# Patient Record
Sex: Male | Born: 1965 | Race: White | Hispanic: No | Marital: Married | Smoking: Never smoker
Health system: Southern US, Community
[De-identification: ages and names within clinical notes are randomized; demographics above are authoritative.]

## PROBLEM LIST (undated history)

## (undated) DIAGNOSIS — E785 Hyperlipidemia, unspecified: Secondary | ICD-10-CM

## (undated) DIAGNOSIS — R42 Dizziness and giddiness: Secondary | ICD-10-CM

## (undated) DIAGNOSIS — B20 Human immunodeficiency virus [HIV] disease: Secondary | ICD-10-CM

## (undated) HISTORY — PX: HYDROCELE EXCISION / REPAIR: SUR1145

---

## 2018-01-01 ENCOUNTER — Other Ambulatory Visit: Payer: Self-pay | Admitting: Podiatry

## 2018-01-01 DIAGNOSIS — D489 Neoplasm of uncertain behavior, unspecified: Secondary | ICD-10-CM

## 2018-01-21 ENCOUNTER — Ambulatory Visit
Admission: RE | Admit: 2018-01-21 | Discharge: 2018-01-21 | Disposition: A | Discharge status: Home / Self Care | Payer: BLUE CROSS/BLUE SHIELD | Source: Ambulatory Visit | Attending: Podiatry | Admitting: Podiatry

## 2018-01-21 DIAGNOSIS — M19072 Primary osteoarthritis, left ankle and foot: Secondary | ICD-10-CM | POA: Insufficient documentation

## 2018-01-21 DIAGNOSIS — M25475 Effusion, left foot: Secondary | ICD-10-CM | POA: Diagnosis not present

## 2018-01-21 DIAGNOSIS — D489 Neoplasm of uncertain behavior, unspecified: Secondary | ICD-10-CM | POA: Diagnosis present

## 2018-01-21 MED ORDER — GADOBUTROL 1 MMOL/ML IV SOLN
7.5000 mL | Freq: Once | INTRAVENOUS | Status: AC | PRN
  Administered 2018-01-21: 7.5 mL via INTRAVENOUS

## 2018-02-12 ENCOUNTER — Other Ambulatory Visit: Payer: Self-pay | Admitting: Podiatry

## 2018-02-13 ENCOUNTER — Other Ambulatory Visit: Payer: Self-pay

## 2018-02-13 ENCOUNTER — Other Ambulatory Visit: Payer: Self-pay | Admitting: Podiatry

## 2018-02-13 ENCOUNTER — Encounter: Payer: Self-pay | Admitting: *Deleted

## 2018-02-19 ENCOUNTER — Ambulatory Visit: Payer: BLUE CROSS/BLUE SHIELD | Attending: Infectious Diseases | Admitting: Infectious Diseases

## 2018-02-19 ENCOUNTER — Encounter: Payer: Self-pay | Admitting: Infectious Diseases

## 2018-02-19 VITALS — BP 114/80 | HR 85 | Temp 97.9°F | Wt 179.0 lb

## 2018-02-19 DIAGNOSIS — E785 Hyperlipidemia, unspecified: Secondary | ICD-10-CM | POA: Diagnosis not present

## 2018-02-19 DIAGNOSIS — Z79899 Other long term (current) drug therapy: Secondary | ICD-10-CM

## 2018-02-19 DIAGNOSIS — E881 Lipodystrophy, not elsewhere classified: Secondary | ICD-10-CM | POA: Insufficient documentation

## 2018-02-19 DIAGNOSIS — B20 Human immunodeficiency virus [HIV] disease: Secondary | ICD-10-CM

## 2018-02-19 DIAGNOSIS — M2021 Hallux rigidus, right foot: Secondary | ICD-10-CM | POA: Diagnosis not present

## 2018-02-19 DIAGNOSIS — M2022 Hallux rigidus, left foot: Secondary | ICD-10-CM | POA: Diagnosis not present

## 2018-02-19 NOTE — Patient Instructions (Addendum)
You are here to engage in care today. Your Vl < 40 and cd4 846. Continue Genvoya We can check your labs in 6 months and follow up 6 months

## 2018-02-19 NOTE — Progress Notes (Signed)
NAME: Keith Steele  DOB: October 29, 1965  MRN: [id number]  Date/Time: 02/19/2018 10:26 AM Subjective:  REASON FOR CONSULT: to establish care for HIV ? Keith Steele is a 52 y.o. with a history of HIV is here to establish care Diagnosed in 1993 currently on genvoya- was follwoed by Dr.Fitzgerald who is no longer in the country  100% adherent to genvoya- no side effects like nausea, vomiting or abdominal pain  HIV diagnosed in [place] >200 OI none other than shingles HAARt history Azt, D4T, sustiva, crixivan complera Now genvoya Acquired thru sex with men Genotype-unknown Was in [place], then [place], now in [place]? Past Medical History:  Diagnosis Date  . HIV (human immunodeficiency virus infection) ([place])   . Hyperlipidemia   . Vertigo    2 episodes. last in 2017   lipoatrophy face - gets sculptra injections Hallux rigidus rt great toe- gets steroid injections Hallux rigidus left great toe  Past Surgical History:  Procedure Laterality Date  . HYDROCELE EXCISION / REPAIR     sculptra fillers  [place] Lives with his partner/husband  FH  Dad-alzheimers GM alzheimers Uncle-DM  ? Current Outpatient Medications  Medication Sig Dispense Refill  . elvitegravir-cobicistat-emtricitabine-tenofovir (GENVOYA) 150-150-200-10 MG TABS tablet Take 1 tablet by mouth daily.    . Multiple Vitamin (MULTIVITAMIN) tablet Take 1 tablet by mouth daily.    . pravastatin (PRAVACHOL) 20 MG tablet Take 20 mg by mouth daily.     No current facility-administered medications for this visit.     REVIEW OF SYSTEMS:  Const: negative fever, negative chills, negative weight loss Eyes: negative diplopia or visual changes, negative eye pain ENT: negative coryza, negative sore throat Resp: negative cough, hemoptysis, dyspnea Cards: negative for chest pain, palpitations, lower extremity edema GU: negative for frequency, dysuria and hematuria Skin: negative for rash and pruritus Heme:  negative for easy bruising and gum/nose bleeding MS: pain left great toe negative for myalgias, , back pain and muscle weakness Neurolo:negative for headaches, dizziness, vertigo, memory problems  Psych: negative for feelings of anxiety, depression   Objective:  VITALS:  BP 114/80 (BP Location: Left Arm, Patient Position: Sitting, Cuff Size: Normal)   Pulse 85   Temp 97.9 F (36.6 C) (Oral)   Wt 179 lb (81.2 kg)   BMI 26.43 kg/m  PHYSICAL EXAM:  General: Alert, cooperative, no distress, appears stated age.  Head: Normocephalic, without obvious abnormality, atraumatic. Eyes: Conjunctivae clear, anicteric sclerae. Pupils are equal Nose: Nares normal. No drainage or sinus tenderness. Throat: Lips, mucosa, and tongue normal. No Thrush Mild facial lipoatrphy Neck: Supple, symmetrical, no adenopathy, thyroid: non tender no carotid bruit and no JVD. Back: No CVA tenderness. Lungs: Clear to auscultation bilaterally. No Wheezing or Rhonchi. No rales. Heart: Regular rate and rhythm, no murmur, rub or gallop. Abdomen: Soft, non-tender,not distended. Bowel sounds normal. No masses Extremities: Extremities normal, atraumatic, no cyanosis. No edema. No clubbing Skin: No rashes or lesions. Not Jaundiced Lymph: Cervical, supraclavicular normal. Neurologic: Grossly non-focal Pertinent Labs Aug 2019- Vl < 20 and cd4 is 856   Health maintenance Vaccination pneumovac- 23 Prevnar-13 HepB HepA TdaP Flu-2019 Herpes zoster- 1  HPV ______________________ labs RPR-11/21/17 NR HEPC ab-NR feb 2019 Lipid-aug 2018 TC 169, TGL 78, HDL 54.9, LDL 99 CMv TOXO -IGG/IgM HIV VL < 40 Cd4-846 (46%) quantiferon Gold GC/CHL [id number] Genotype HIV antibody  Preventive  Dental-march 2019 Colonoscopy-2017 Opthal-nov 2019 Anal  Pap- 2013 mammogram  Impression/Recommendation ? HIV- well controlled on HAART- Genvoya ( elvitegravir+cobi+FTC+TAF)  Last Vl < 40 and cd4 is 856 Continue  HAART  Hyperlipidemia on pravastatin  Left hallux rigidus- surgery planned by Dr.Fowler  ?Cr of 1.3 with crcl of 58 -stable Partner notification- pt's partner is aware of his status and he is negative- in a monogamous relationship- U=U  ( undetectable + untransmittable)   Labs planned in the next few months quantiferon gold, RPR, CMP, CD4, HIV We need to update his health maintenance- need to get old records from [facility] regarding his vaccination status Will need anal pap in the future  Follow up 6 months ? ___________________________________________________ Discussed with patient in detail about the pathogenesis, health maintenance, labs and adherence

## 2018-02-20 ENCOUNTER — Encounter
Admission: RE | Disposition: A | Discharge status: Home / Self Care | Payer: Self-pay | Source: Ambulatory Visit | Attending: Podiatry

## 2018-02-20 ENCOUNTER — Ambulatory Visit
Admission: RE | Admit: 2018-02-20 | Discharge: 2018-02-20 | Disposition: A | Discharge status: Home / Self Care | Payer: BLUE CROSS/BLUE SHIELD | Source: Ambulatory Visit | Attending: Podiatry | Admitting: Podiatry

## 2018-02-20 ENCOUNTER — Ambulatory Visit: Payer: BLUE CROSS/BLUE SHIELD | Admitting: Anesthesiology

## 2018-02-20 DIAGNOSIS — Z820 Family history of epilepsy and other diseases of the nervous system: Secondary | ICD-10-CM | POA: Insufficient documentation

## 2018-02-20 DIAGNOSIS — M205X2 Other deformities of toe(s) (acquired), left foot: Secondary | ICD-10-CM | POA: Insufficient documentation

## 2018-02-20 DIAGNOSIS — E785 Hyperlipidemia, unspecified: Secondary | ICD-10-CM | POA: Diagnosis not present

## 2018-02-20 DIAGNOSIS — Z79899 Other long term (current) drug therapy: Secondary | ICD-10-CM | POA: Diagnosis not present

## 2018-02-20 DIAGNOSIS — Z21 Asymptomatic human immunodeficiency virus [HIV] infection status: Secondary | ICD-10-CM | POA: Diagnosis not present

## 2018-02-20 DIAGNOSIS — R2242 Localized swelling, mass and lump, left lower limb: Secondary | ICD-10-CM | POA: Insufficient documentation

## 2018-02-20 DIAGNOSIS — N189 Chronic kidney disease, unspecified: Secondary | ICD-10-CM | POA: Diagnosis not present

## 2018-02-20 HISTORY — DX: Hyperlipidemia, unspecified: E78.5

## 2018-02-20 HISTORY — PX: CHEILECTOMY: SHX1336

## 2018-02-20 HISTORY — DX: Dizziness and giddiness: R42

## 2018-02-20 HISTORY — PX: MASS EXCISION: SHX2000

## 2018-02-20 HISTORY — DX: Human immunodeficiency virus (HIV) disease: B20

## 2018-02-20 SURGERY — CHEILECTOMY
Anesthesia: General | Site: Foot | Laterality: Left

## 2018-02-20 MED ORDER — DEXAMETHASONE SODIUM PHOSPHATE 4 MG/ML IJ SOLN
INTRAMUSCULAR | Status: DC | PRN
  Administered 2018-02-20: 4 mg via INTRAVENOUS

## 2018-02-20 MED ORDER — MIDAZOLAM HCL 5 MG/5ML IJ SOLN
INTRAMUSCULAR | Status: DC | PRN
  Administered 2018-02-20: 2 mg via INTRAVENOUS

## 2018-02-20 MED ORDER — ONDANSETRON HCL 4 MG/2ML IJ SOLN
INTRAMUSCULAR | Status: DC | PRN
  Administered 2018-02-20: 4 mg via INTRAVENOUS

## 2018-02-20 MED ORDER — FENTANYL CITRATE (PF) 100 MCG/2ML IJ SOLN: 25.0000 ug | INTRAMUSCULAR | Status: DC | PRN

## 2018-02-20 MED ORDER — OXYCODONE-ACETAMINOPHEN 5-325 MG PO TABS: 1.0000 | ORAL_TABLET | Freq: Four times a day (QID) | ORAL | 0 refills | Status: AC | PRN

## 2018-02-20 MED ORDER — PROPOFOL 10 MG/ML IV BOLUS
INTRAVENOUS | Status: DC | PRN
  Administered 2018-02-20: 200 mg via INTRAVENOUS

## 2018-02-20 MED ORDER — ONDANSETRON HCL 4 MG/2ML IJ SOLN: 4.0000 mg | Freq: Once | INTRAMUSCULAR | Status: DC | PRN

## 2018-02-20 MED ORDER — OXYCODONE HCL 5 MG PO TABS: 5.0000 mg | ORAL_TABLET | Freq: Once | ORAL | Status: DC | PRN

## 2018-02-20 MED ORDER — EPHEDRINE SULFATE 50 MG/ML IJ SOLN
INTRAMUSCULAR | Status: DC | PRN
  Administered 2018-02-20 (×4): 10 mg via INTRAVENOUS

## 2018-02-20 MED ORDER — BUPIVACAINE HCL 0.25 % IJ SOLN
INTRAMUSCULAR | Status: DC | PRN
  Administered 2018-02-20: 10 mL

## 2018-02-20 MED ORDER — POVIDONE-IODINE 7.5 % EX SOLN
Freq: Once | CUTANEOUS | Status: AC
  Administered 2018-02-20: 12:00:00 via TOPICAL

## 2018-02-20 MED ORDER — CEFAZOLIN SODIUM-DEXTROSE 2-4 GM/100ML-% IV SOLN
2.0000 g | INTRAVENOUS | Status: AC
  Administered 2018-02-20: 2 g via INTRAVENOUS

## 2018-02-20 MED ORDER — BUPIVACAINE LIPOSOME 1.3 % IJ SUSP
INTRAMUSCULAR | Status: DC | PRN
  Administered 2018-02-20: 10 mL

## 2018-02-20 MED ORDER — LIDOCAINE HCL (CARDIAC) PF 100 MG/5ML IV SOSY
PREFILLED_SYRINGE | INTRAVENOUS | Status: DC | PRN
  Administered 2018-02-20: 40 mg via INTRATRACHEAL

## 2018-02-20 MED ORDER — OXYCODONE HCL 5 MG/5ML PO SOLN: 5.0000 mg | Freq: Once | ORAL | Status: DC | PRN

## 2018-02-20 MED ORDER — ACETAMINOPHEN 160 MG/5ML PO SOLN: 325.0000 mg | ORAL | Status: DC | PRN

## 2018-02-20 MED ORDER — LACTATED RINGERS IV SOLN
INTRAVENOUS | Status: DC
  Administered 2018-02-20 (×2): via INTRAVENOUS

## 2018-02-20 MED ORDER — ACETAMINOPHEN 325 MG PO TABS: 325.0000 mg | ORAL_TABLET | ORAL | Status: DC | PRN

## 2018-02-20 MED ORDER — FENTANYL CITRATE (PF) 100 MCG/2ML IJ SOLN
INTRAMUSCULAR | Status: DC | PRN
  Administered 2018-02-20 (×2): 25 ug via INTRAVENOUS

## 2018-02-20 SURGICAL SUPPLY — 34 items
BANDAGE ELASTIC 4 VELCRO NS (GAUZE/BANDAGES/DRESSINGS) ×2 IMPLANT
BENZOIN TINCTURE PRP APPL 2/3 (GAUZE/BANDAGES/DRESSINGS) ×2 IMPLANT
BLADE OSC/SAGITTAL MD 9X18.5 (BLADE) ×2 IMPLANT
BNDG COHESIVE 4X5 TAN STRL (GAUZE/BANDAGES/DRESSINGS) ×2 IMPLANT
BNDG ESMARK 4X12 TAN STRL LF (GAUZE/BANDAGES/DRESSINGS) ×2 IMPLANT
BNDG GAUZE 4.5X4.1 6PLY STRL (MISCELLANEOUS) ×2 IMPLANT
BNDG STRETCH 4X75 STRL LF (GAUZE/BANDAGES/DRESSINGS) ×2 IMPLANT
CANISTER SUCT 1200ML W/VALVE (MISCELLANEOUS) ×2 IMPLANT
COVER LIGHT HANDLE UNIVERSAL (MISCELLANEOUS) ×4 IMPLANT
CUFF TOURN SGL QUICK 18 (TOURNIQUET CUFF) ×2 IMPLANT
DRAPE FLUOR MINI C-ARM 54X84 (DRAPES) ×2 IMPLANT
DURAPREP 26ML APPLICATOR (WOUND CARE) ×2 IMPLANT
ELECT REM PT RETURN 9FT ADLT (ELECTROSURGICAL) ×2
ELECTRODE REM PT RTRN 9FT ADLT (ELECTROSURGICAL) ×1 IMPLANT
GAUZE PETRO XEROFOAM 1X8 (MISCELLANEOUS) ×2 IMPLANT
GAUZE SPONGE 4X4 12PLY STRL (GAUZE/BANDAGES/DRESSINGS) ×2 IMPLANT
GLOVE BIO SURGEON STRL SZ7.5 (GLOVE) ×2 IMPLANT
GLOVE INDICATOR 8.0 STRL GRN (GLOVE) ×2 IMPLANT
GOWN STRL REUS W/ TWL LRG LVL3 (GOWN DISPOSABLE) ×2 IMPLANT
GOWN STRL REUS W/TWL LRG LVL3 (GOWN DISPOSABLE) ×2
KIT TURNOVER KIT A (KITS) ×2 IMPLANT
NS IRRIG 500ML POUR BTL (IV SOLUTION) ×2 IMPLANT
PACK EXTREMITY ARMC (MISCELLANEOUS) ×2 IMPLANT
PENCIL SMOKE EVACUATOR (MISCELLANEOUS) ×2 IMPLANT
RASP SM TEAR CROSS CUT (RASP) ×2 IMPLANT
STOCKINETTE IMPERVIOUS LG (DRAPES) ×2 IMPLANT
STRIP CLOSURE SKIN 1/4X4 (GAUZE/BANDAGES/DRESSINGS) ×2 IMPLANT
SUT MNCRL 5-0+ PC-1 (SUTURE) IMPLANT
SUT MNCRL+ 5-0 UNDYED PC-3 (SUTURE) ×1 IMPLANT
SUT MONOCRYL 5-0 (SUTURE) ×1
SUT VIC AB 3-0 SH 27 (SUTURE) ×1
SUT VIC AB 3-0 SH 27X BRD (SUTURE) ×1 IMPLANT
SUT VIC AB 4-0 SH 27 (SUTURE) ×1
SUT VIC AB 4-0 SH 27XANBCTRL (SUTURE) ×1 IMPLANT

## 2018-02-20 NOTE — Op Note (Signed)
Operative note   Surgeon:Keith Steele Lawyer: None    Preop diagnosis: 1.  Hallux limitus left first MTPJ 2.  Soft tissue mass plantar left great toe    Postop diagnosis: Same    Procedure: 1.  Cheilectomy left first MTPJ 2 excision soft tissue mass plantar left great toe 3 intraoperative use of fluoroscopy    EBL: Minimal    Anesthesia:local and general.  Local consisted of a one-to-one mixture of 0.25% bupivacaine and Exparel long-acting anesthetic.  A total of 20 cc was used    Hemostasis: Ankle tourniquet inflated to 200 mmHg for approximately 45 minutes    Specimen: Soft tissue mass plantar left great toe    Complications: None    Operative indications:Keith Steele is an 52 y.o. that presents today for surgical intervention.  The risks/benefits/alternatives/complications have been discussed and consent has been given.    Procedure:  Patient was brought into the OR and placed on the operating table in thesupine position. After anesthesia was obtained theleft lower extremity was prepped and draped in usual sterile fashion.  Prior to incision the first MTPJ was evaluated with use of fluoroscopy that revealed a small amount of periarticular spurring.  Attention was initially directed to the dorsal aspect of the first MTPJ where dorsal medial incision was performed.  Sharp and blunt dissection carried down to the capsule.  Longitudinal capsulotomy was then performed.  This exposed the head of the metatarsal and base of the proximal phalanx.  This time there was noted to be a small amount of periarticular spurring around the first MTPJ.  With the use of a power saw and power rasp I was able to remove the periarticular spurring and recontour the metatarsal head and base of the proximal phalanx.  Evaluation of the joint revealed a moderate amount of loss of articular cartilage most notably to the dorsal lateral aspect of the first metatarsal head.  Remainder of the joint looked to  be intact.  The base of the proximal phalanx had good articular cartilage.  After removal of all the periarticular spurring and evaluation fluoroscopy was then used to reveal the removal of the periarticular spurring.  Joint was then flushed with copious amounts irrigation.  Good motion of the great toe joint was noted.  Closure was then performed with a 3-0 Vicryl for the capsule, 4-0 Vicryl the subcutaneous tissue and a 5-0 Monocryl undyed for skin.  Attention was then directed to the plantar medial aspect of the great toe where a second incision was performed.  This was overlying a small soft tissue mass.  Skin incision was made and then blunt dissection carried down into the subtenons tissue.  Time there was noted to be a small soft tissue mass.  It looked to be lobulated consistent with a lipoma.  Blunt and sharp dissection carried down deep to the level of the flexor tendon region.  The mass was then removed from the surgical field in toto.  All bleeders were Bovie cauterized and the wound was flushed with copious amounts of irrigation.  The mass was then sent for pathological examination.  Closure was performed with 4-0 Vicryl the subcutaneous tissue and a 5-0 Monocryl undyed for skin.    Patient tolerated the procedure and anesthesia well.  Was transported from the OR to the PACU with all vital signs stable and vascular status intact. To be discharged per routine protocol.  Will follow up in approximately 1 week in the outpatient clinic.

## 2018-02-20 NOTE — Anesthesia Preprocedure Evaluation (Signed)
Anesthesia Evaluation  Patient identified by MRN, date of birth, ID band Patient awake    Reviewed: Allergy & Precautions, H&P , NPO status , Patient's Chart, lab work & pertinent test results  Airway Mallampati: II  TM Distance: >3 FB Neck ROM: full    Dental no notable dental hx.    Pulmonary    Pulmonary exam normal breath sounds clear to auscultation       Cardiovascular Normal cardiovascular exam Rhythm:regular Rate:Normal     Neuro/Psych    GI/Hepatic   Endo/Other    Renal/GU      Musculoskeletal   Abdominal   Peds  Hematology  (+) HIV,   Anesthesia Other Findings   Reproductive/Obstetrics                             Anesthesia Physical Anesthesia Plan  ASA: II  Anesthesia Plan: General LMA   Post-op Pain Management:    Induction:   PONV Risk Score and Plan: 2 and Ondansetron, Dexamethasone, Midazolam and Treatment may vary due to age or medical condition  Airway Management Planned:   Additional Equipment:   Intra-op Plan:   Post-operative Plan:   Informed Consent: I have reviewed the patients History and Physical, chart, labs and discussed the procedure including the risks, benefits and alternatives for the proposed anesthesia with the patient or authorized representative who has indicated his/her understanding and acceptance.     Plan Discussed with: CRNA  Anesthesia Plan Comments:         Anesthesia Quick Evaluation

## 2018-02-20 NOTE — H&P (Signed)
HISTORY AND PHYSICAL INTERVAL NOTE:  02/20/2018  12:00 PM  Keith Steele  has presented today for surgery, with the diagnosis of Acute foot pain left.  The various methods of treatment have been discussed with the patient.  No guarantees were given.  After consideration of risks, benefits and other options for treatment, the patient has consented to surgery.  I have reviewed the patients' chart and labs.     A history and physical examination was performed in my office.  The patient was reexamined.  There have been no changes to this history and physical examination.  Samara Deist A

## 2018-02-20 NOTE — Transfer of Care (Signed)
Immediate Anesthesia Transfer of Care Note  Patient: Keith Steele  Procedure(s) Performed: CHEILECTOMY (Left Foot) EXCISION tumor foot left (Left Foot)  Patient Location: PACU  Anesthesia Type: General LMA  Level of Consciousness: awake, alert  and patient cooperative  Airway and Oxygen Therapy: Patient Spontanous Breathing and Patient connected to supplemental oxygen  Post-op Assessment: Post-op Vital signs reviewed, Patient's Cardiovascular Status Stable, Respiratory Function Stable, Patent Airway and No signs of Nausea or vomiting  Post-op Vital Signs: Reviewed and stable  Complications: No apparent anesthesia complications

## 2018-02-20 NOTE — Anesthesia Postprocedure Evaluation (Signed)
Anesthesia Post Note  Patient: Keith Steele  Procedure(s) Performed: CHEILECTOMY (Left Foot) EXCISION tumor foot left (Left Foot)  Patient location during evaluation: PACU Anesthesia Type: General Level of consciousness: awake and alert and oriented Pain management: satisfactory to patient Vital Signs Assessment: post-procedure vital signs reviewed and stable Respiratory status: spontaneous breathing, nonlabored ventilation and respiratory function stable Cardiovascular status: blood pressure returned to baseline and stable Postop Assessment: Adequate PO intake and No signs of nausea or vomiting Anesthetic complications: no    Raliegh Ip

## 2018-02-20 NOTE — Anesthesia Procedure Notes (Signed)
Procedure Name: LMA Insertion Date/Time: 02/20/2018 12:15 PM Performed by: Janna Arch, CRNA Pre-anesthesia Checklist: Patient identified, Emergency Drugs available, Suction available, Timeout performed and Patient being monitored Patient Re-evaluated:Patient Re-evaluated prior to induction Oxygen Delivery Method: Circle system utilized Preoxygenation: Pre-oxygenation with 100% oxygen Induction Type: IV induction LMA: LMA inserted LMA Size: 4.0 Number of attempts: 1 Placement Confirmation: positive ETCO2 and breath sounds checked- equal and bilateral Tube secured with: Tape

## 2018-02-20 NOTE — Discharge Instructions (Signed)
Keith Steele DR. [place]   1. Take your medication as prescribed.  Pain medication should be taken only as needed.  2. Keep the dressing clean, dry and intact.  3. Keep your foot elevated above the heart level for the first 48 hours.  4. Walking to the bathroom and brief periods of walking are acceptable, unless we have instructed you to be non-weight bearing.  5. Always wear your post-op shoe when walking.  Always use your crutches if you are to be non-weight bearing.  6. Do not take a shower. Baths are permissible as long as the foot is kept out of the water.   7. Every hour you are awake:  - Bend your knee 15 times. - Flex foot 15 times - Massage calf 15 times  8. Call [facility] [phone]) if any of the following problems occur: - You develop a temperature or fever. - The bandage becomes saturated with blood. - Medication does not stop your pain. - Injury of the foot occurs. - Any symptoms of infection including redness, odor, or red streaks running from wound.  Information for Discharge Teaching: EXPAREL (bupivacaine liposome injectable suspension)   Your surgeon or anesthesiologist gave you EXPAREL(bupivacaine) to help control your pain after surgery.   EXPAREL is a local anesthetic that provides pain relief by numbing the tissue around the surgical site.  EXPAREL is designed to release pain medication over time and can control pain for up to 72 hours.  Depending on how you respond to EXPAREL, you may require less pain medication during your recovery.  Possible side effects:  Temporary loss of sensation or ability to move in the area where bupivacaine was injected.  Nausea, vomiting, constipation  Rarely, numbness and tingling in your mouth or lips, lightheadedness, or anxiety may occur.  Call your doctor right away if you think  you may be experiencing any of these sensations, or if you have other questions regarding possible side effects.  Follow all other discharge instructions given to you by your surgeon or nurse. Eat a healthy diet and drink plenty of water or other fluids.  If you return to the hospital for any reason within 96 hours following the administration of EXPAREL, it is important for health care providers to know that you have received this anesthetic. A teal colored band has been placed on your arm with the date, time and amount of EXPAREL you have received in order to alert and inform your health care providers. Please leave this armband in place for the full 96 hours following administration, and then you may remove the band.  General Anesthesia, Adult, Care After These instructions provide you with information about caring for yourself after your procedure. Your health care provider may also give you more specific instructions. Your treatment has been planned according to current medical practices, but problems sometimes occur. Call your health care provider if you have any problems or questions after your procedure. What can I expect after the procedure? After the procedure, it is common to have:  Vomiting.  A sore throat.  Mental slowness.  It is common to feel:  Nauseous.  Cold or shivery.  Sleepy.  Tired.  Sore or achy, even in parts of your body where you did not have surgery.  Follow these instructions at home: For at least 24 hours after the procedure:  Do not: ? Participate in activities where  you could fall or become injured. ? Drive. ? Use heavy machinery. ? Drink alcohol. ? Take sleeping pills or medicines that cause drowsiness. ? Make important decisions or sign legal documents. ? Take care of children on your own.  Rest. Eating and drinking  If you vomit, drink water, juice, or soup when you can drink without vomiting.  Drink enough fluid to keep your urine clear  or pale yellow.  Make sure you have little or no nausea before eating solid foods.  Follow the diet recommended by your health care provider. General instructions  Have a responsible adult stay with you until you are awake and alert.  Return to your normal activities as told by your health care provider. Ask your health care provider what activities are safe for you.  Take over-the-counter and prescription medicines only as told by your health care provider.  If you smoke, do not smoke without supervision.  Keep all follow-up visits as told by your health care provider. This is important. Contact a health care provider if:  You continue to have nausea or vomiting at home, and medicines are not helpful.  You cannot drink fluids or start eating again.  You cannot urinate after 8-12 hours.  You develop a skin rash.  You have fever.  You have increasing redness at the site of your procedure. Get help right away if:  You have difficulty breathing.  You have chest pain.  You have unexpected bleeding.  You feel that you are having a life-threatening or urgent problem. This information is not intended to replace advice given to you by your health care provider. Make sure you discuss any questions you have with your health care provider. Document Released: 06/19/2000 Document Revised: 08/16/2015 Document Reviewed: 02/25/2015 Elsevier Interactive Patient Education  [employer].

## 2018-02-25 LAB — SURGICAL PATHOLOGY

## 2018-05-28 ENCOUNTER — Other Ambulatory Visit: Payer: Self-pay | Admitting: Licensed Clinical Social Worker

## 2018-05-28 ENCOUNTER — Telehealth: Payer: Self-pay | Admitting: Infectious Diseases

## 2018-05-28 ENCOUNTER — Other Ambulatory Visit: Payer: Self-pay | Admitting: Infectious Diseases

## 2018-05-28 MED ORDER — ELVITEG-COBIC-EMTRICIT-TENOFAF 150-150-200-10 MG PO TABS: 1.0000 | ORAL_TABLET | Freq: Every day | ORAL | 0 refills | Status: DC

## 2018-05-28 MED ORDER — ELVITEG-COBIC-EMTRICIT-TENOFAF 150-150-200-10 MG PO TABS: 1.0000 | ORAL_TABLET | Freq: Every day | ORAL | 3 refills | Status: DC

## 2018-05-28 NOTE — Telephone Encounter (Signed)
Provider's nurse called saying pt is requesting refill on HIV meds Genvoya I believe.  Do you want me to make him an appt to be seen?

## 2018-05-28 NOTE — Telephone Encounter (Signed)
E-prescribed Genvoya- His next appt will be in [place]

## 2018-06-11 ENCOUNTER — Ambulatory Visit: Payer: BLUE CROSS/BLUE SHIELD | Admitting: Infectious Diseases

## 2018-08-13 ENCOUNTER — Ambulatory Visit: Payer: BLUE CROSS/BLUE SHIELD | Admitting: Infectious Diseases

## 2018-09-24 ENCOUNTER — Encounter: Payer: Self-pay | Admitting: Infectious Diseases

## 2018-09-24 ENCOUNTER — Ambulatory Visit: Payer: BC Managed Care – PPO | Attending: Infectious Diseases | Admitting: Infectious Diseases

## 2018-09-24 ENCOUNTER — Other Ambulatory Visit
Admission: RE | Admit: 2018-09-24 | Discharge: 2018-09-24 | Disposition: A | Discharge status: Home / Self Care | Payer: BC Managed Care – PPO | Source: Ambulatory Visit | Attending: Infectious Diseases | Admitting: Infectious Diseases

## 2018-09-24 ENCOUNTER — Other Ambulatory Visit: Payer: Self-pay

## 2018-09-24 VITALS — BP 149/92 | HR 74 | Temp 97.9°F | Ht 69.0 in | Wt 186.0 lb

## 2018-09-24 DIAGNOSIS — E785 Hyperlipidemia, unspecified: Secondary | ICD-10-CM | POA: Diagnosis not present

## 2018-09-24 DIAGNOSIS — B20 Human immunodeficiency virus [HIV] disease: Secondary | ICD-10-CM | POA: Insufficient documentation

## 2018-09-24 DIAGNOSIS — Z79899 Other long term (current) drug therapy: Secondary | ICD-10-CM

## 2018-09-24 DIAGNOSIS — Z9889 Other specified postprocedural states: Secondary | ICD-10-CM

## 2018-09-24 LAB — COMPREHENSIVE METABOLIC PANEL
ALT: 39 U/L (ref 0–44)
AST: 40 U/L (ref 15–41)
Albumin: 4.4 g/dL (ref 3.5–5.0)
Alkaline Phosphatase: 84 U/L (ref 38–126)
Anion gap: 6 (ref 5–15)
BUN: 20 mg/dL (ref 6–20)
CO2: 27 mmol/L (ref 22–32)
Calcium: 8.8 mg/dL — ABNORMAL LOW (ref 8.9–10.3)
Chloride: 105 mmol/L (ref 98–111)
Creatinine, Ser: 1.19 mg/dL (ref 0.61–1.24)
GFR calc Af Amer: >60 mL/min (ref >60)
GFR calc non Af Amer: >60 mL/min (ref >60)
Glucose, Bld: 105 mg/dL — ABNORMAL HIGH (ref 70–99)
Potassium: 4.1 mmol/L (ref 3.5–5.1)
Sodium: 138 mmol/L (ref 135–145)
Total Bilirubin: 0.7 mg/dL (ref 0.3–1.2)
Total Protein: 7 g/dL (ref 6.5–8.1)

## 2018-09-24 LAB — LIPID PANEL
Cholesterol: 192 mg/dL (ref 0–200)
HDL: 48 mg/dL (ref >40)
LDL Cholesterol: 118 mg/dL — ABNORMAL HIGH (ref 0–99)
Total CHOL/HDL Ratio: 4 RATIO
Triglycerides: 132 mg/dL (ref <150)
VLDL: 26 mg/dL (ref 0–40)

## 2018-09-24 LAB — CHLAMYDIA/NGC RT PCR (ARMC ONLY)
Chlamydia Tr: NOT DETECTED
N gonorrhoeae: NOT DETECTED

## 2018-09-24 MED ORDER — ELVITEG-COBIC-EMTRICIT-TENOFAF 150-150-200-10 MG PO TABS: 1.0000 | ORAL_TABLET | Freq: Every day | ORAL | 5 refills | Status: DC

## 2018-09-24 NOTE — Progress Notes (Signed)
NAME: Keith Steele  DOB: 06-17-1965  MRN: [id number]  Date/Time: 09/24/2018 9:24 AM   Subjective:   ?Follow-up visit for HIV. Keith Steele is a 53 y.o. male with a history of HIV.  This is a second visit to me.  He is currently on Genvoya and is 100% adherent to the medications. Does not have any side effects. No recent labs. Last labs were from June 2019. Since his last visit in October 2019 he underwent a fibrofatty mass removal from his left foot near the great toe.   HIV diagnosed in [place] >200 OI none other than shingles HAARt history Azt, D4T, sustiva, crixivan complera Now genvoya Acquired thru sex with men Genotype-unknown Was in [place], then [place], now in [place].  Was followed by Dr. [facility] until he moved.  Patient transferred his care to me in October 2019.  ? Genotype: Unknown  Past Medical History:  Diagnosis Date  . HIV (human immunodeficiency virus infection) ([place])   . Hyperlipidemia   . Vertigo    2 episodes. last in 2017    Past Surgical History:  Procedure Laterality Date  . CHEILECTOMY Left 02/20/2018   Procedure: CHEILECTOMY;  Surgeon: Samara Deist, DPM;  Location: [facility];  Service: Podiatry;  Laterality: Left;  GENERAL WITH LOCAL  . HYDROCELE EXCISION / REPAIR    . MASS EXCISION Left 02/20/2018   Procedure: EXCISION tumor foot left;  Surgeon: Samara Deist, DPM;  Location: [facility];  Service: Podiatry;  Laterality: Left;    Social History   Socioeconomic History  . Marital status: Married    Spouse name: Not on file  . Number of children: Not on file  . Years of education: Not on file  . Highest education level: Not on file  Occupational History  . Not on file  Social Needs  . Financial resource strain: Not on file  . Food insecurity    Worry: Not on file    Inability: Not on file  . Transportation needs    Medical: Not on file    Non-medical: Not on file  Tobacco Use   . Smoking status: Never Smoker  . Smokeless tobacco: Never Used  Substance and Sexual Activity  . Alcohol use: Yes    Alcohol/week: 1.0 standard drinks    Types: 1 Glasses of wine per week  . Drug use: Not on file  . Sexual activity: Not on file  Lifestyle  . Physical activity    Days per week: Not on file    Minutes per session: Not on file  . Stress: Not on file  Relationships  . Social Herbalist on phone: Not on file    Gets together: Not on file    Attends religious service: Not on file    Active member of club or organization: Not on file    Attends meetings of clubs or organizations: Not on file    Relationship status: Not on file  . Intimate partner violence    Fear of current or ex partner: Not on file    Emotionally abused: Not on file    Physically abused: Not on file    Forced sexual activity: Not on file  Other Topics Concern  . Not on file  Social History Narrative  . Not on file    FH  Dad-alzheimers GM alzheimers Uncle-DM   No Known Allergies ? Current Outpatient Medications  Medication Sig Dispense Refill  .  elvitegravir-cobicistat-emtricitabine-tenofovir (GENVOYA) 150-150-200-10 MG TABS tablet Take 1 tablet by mouth daily. 30 tablet 3  . Multiple Vitamin (MULTIVITAMIN) tablet Take 1 tablet by mouth daily.    [name] oxyCODONE-acetaminophen (PERCOCET) 5-325 MG tablet Take 1-2 tablets by mouth every 6 (six) hours as needed for severe pain. 20 tablet 0  . pravastatin (PRAVACHOL) 20 MG tablet Take 20 mg by mouth daily.     No current facility-administered medications for this visit.     REVIEW OF SYSTEMS:  Const: negative fever, negative chills, negative weight loss Eyes: negative diplopia or visual changes, negative eye pain ENT: negative coryza, negative sore throat Resp: negative cough, hemoptysis, dyspnea Cards: negative for chest pain, palpitations, lower extremity edema GU: negative for frequency, dysuria and hematuria Skin: negative for  rash and pruritus Heme: negative for easy bruising and gum/nose bleeding MS: Some pain at the site of the left great toe when he is running or doing push-ups. Neurolo:negative for headaches, dizziness, vertigo, memory problems  Psych: negative for feelings of anxiety, depression  Pertinent Positives include : Objective:  VITALS:  BP (!) 149/92   Pulse 74   Temp 97.9 F (36.6 C)   Ht 5\' 9"  (1.753 m)   Wt 186 lb (84.4 kg)   BMI 27.47 kg/m    PHYSICAL EXAM:  General: Alert, cooperative, no distress, appears stated age.  Head: Normocephalic, without obvious abnormality, atraumatic. Eyes: Conjunctivae clear, anicteric sclerae. Pupils are equal Nose: Nares normal. No drainage or sinus tenderness. Throat: Lips, mucosa, and tongue normal. No Thrush Neck: Supple, symmetrical, no adenopathy, thyroid: non tender no carotid bruit and no JVD. Back: No CVA tenderness. Lungs: Clear to auscultation bilaterally. No Wheezing or Rhonchi. No rales. Heart: Regular rate and rhythm, no murmur, rub or gallop. Abdomen: Soft, non-tender,not distended. Bowel sounds normal. No masses Extremities: Surgical scar dorsal aspect of the right great toe.  Extremities normal, atraumatic, no cyanosis. No edema. No clubbing Skin: No rashes or lesions. Not Jaundiced Lymph: Cervical, supraclavicular normal. Neurologic: Grossly non-focal Pertinent Labs None recently IMAGING RESULTS: Health maintenance Vaccination  Vaccine Date last given comment  Influenza    Hepatitis B    Hepatitis A    Prevnar-PCV-13    Pneumovac-PPSV-23    TdaP    HPV    Shingrix ( zoster vaccine)     ______________________  Labs Lab Result  Date comment  HIV VL     CD4     Genotype     [id number]     HIV antibody     RPR     Quantiferon Gold     Hep C ab     Hepatitis B-ab,ag,c     Hepatitis A-IgM, IgG /T     Lipid     GC/CHL     PAP     HB,PLT,Cr, LFT       Preventive  Procedure Result  Date comment  colonoscopy      Mammogram     Dental exam     Opthal       Impression/Recommendation HIV- well controlled on HAART- Genvoya ( elvitegravir+cobi+FTC+TAF) Last Vl < 40 and cd4 is 856 this is from 2019 so we will do new labs today. Continue HAART  Hyperlipidemia on pravastatin  Left hallux rigidus- s/p Cheilectomy left first MTPJ 2 excision soft tissue mass plantar left great toe   ?Partner notification- pt's partner is aware of his status and he is negative- in a monogamous relationship- U=U  ( undetectable + untransmittable)  Called his previous HIV provider in [place] and asked him to fax the immunization status.  Follow up 6 months? ? ? ___________________________________________________ Discussed with patient

## 2018-09-25 LAB — T-HELPER CELLS CD4/CD8 %
% CD 4 Pos. Lymph.: 41.9 % (ref 30.8–58.5)
Absolute CD 4 Helper: 670 /uL (ref 359–1519)
Basophils Absolute: 0 10*3/uL (ref 0.0–0.2)
Basos: 0 %
CD3+CD4+ Cells/CD3+CD8+ Cells Bld: 1.23 (ref 0.92–3.72)
CD3+CD8+ Cells # Bld: 544 /uL (ref 109–897)
CD3+CD8+ Cells NFr Bld: 34 % (ref 12.0–35.5)
EOS (ABSOLUTE): 0.1 10*3/uL (ref 0.0–0.4)
Eos: 1 %
Hematocrit: 42.3 % (ref 37.5–51.0)
Hemoglobin: 14.6 g/dL (ref 13.0–17.7)
Immature Grans (Abs): 0 10*3/uL (ref 0.0–0.1)
Immature Granulocytes: 0 %
Lymphocytes Absolute: 1.6 10*3/uL (ref 0.7–3.1)
Lymphs: 34 %
MCH: 31 pg (ref 26.6–33.0)
MCHC: 34.5 g/dL (ref 31.5–35.7)
MCV: 90 fL (ref 79–97)
Monocytes Absolute: 0.5 10*3/uL (ref 0.1–0.9)
Monocytes: 10 %
Neutrophils Absolute: 2.4 10*3/uL (ref 1.4–7.0)
Neutrophils: 55 %
Platelets: 242 10*3/uL (ref 150–450)
RBC: 4.71 x10E6/uL (ref 4.14–5.80)
RDW: 13.1 % (ref 11.6–15.4)
WBC: 4.5 10*3/uL (ref 3.4–10.8)

## 2018-09-25 LAB — HEPATITIS C ANTIBODY: HCV Ab: <0.1 s/co ratio (ref 0.0–0.9)

## 2018-09-25 LAB — MISC LABCORP TEST (SEND OUT): Labcorp test code: 6530

## 2018-09-25 LAB — RPR: RPR Ser Ql: NONREACTIVE

## 2018-09-26 LAB — QUANTIFERON-TB GOLD PLUS (RQFGPL)
QuantiFERON Mitogen Value: >10 IU/mL
QuantiFERON Nil Value: 0.01 IU/mL
QuantiFERON TB1 Ag Value: 0.02 IU/mL
QuantiFERON TB2 Ag Value: 0.02 IU/mL

## 2018-09-26 LAB — QUANTIFERON-TB GOLD PLUS: QuantiFERON-TB Gold Plus: NEGATIVE

## 2018-09-26 LAB — HIV-1 RNA QUANT-NO REFLEX-BLD
HIV 1 RNA Quant: 30 copies/mL
LOG10 HIV-1 RNA: 1.477 log10copy/mL

## 2019-03-17 ENCOUNTER — Other Ambulatory Visit: Payer: Self-pay | Admitting: Infectious Diseases

## 2019-03-17 MED ORDER — ELVITEG-COBIC-EMTRICIT-TENOFAF 150-150-200-10 MG PO TABS: 1.0000 | ORAL_TABLET | Freq: Every day | ORAL | 5 refills | Status: DC

## 2019-04-01 ENCOUNTER — Other Ambulatory Visit
Admission: RE | Admit: 2019-04-01 | Discharge: 2019-04-01 | Disposition: A | Discharge status: Home / Self Care | Payer: BC Managed Care – PPO | Source: Ambulatory Visit | Attending: Infectious Diseases | Admitting: Infectious Diseases

## 2019-04-01 ENCOUNTER — Ambulatory Visit: Payer: BC Managed Care – PPO | Attending: Infectious Diseases | Admitting: Infectious Diseases

## 2019-04-01 ENCOUNTER — Encounter: Payer: Self-pay | Admitting: Infectious Diseases

## 2019-04-01 ENCOUNTER — Other Ambulatory Visit: Payer: Self-pay

## 2019-04-01 VITALS — BP 115/77 | HR 86 | Temp 97.9°F | Resp 16 | Ht 69.0 in | Wt 184.0 lb

## 2019-04-01 DIAGNOSIS — B20 Human immunodeficiency virus [HIV] disease: Secondary | ICD-10-CM

## 2019-04-01 DIAGNOSIS — H04203 Unspecified epiphora, bilateral lacrimal glands: Secondary | ICD-10-CM

## 2019-04-01 DIAGNOSIS — N189 Chronic kidney disease, unspecified: Secondary | ICD-10-CM | POA: Insufficient documentation

## 2019-04-01 DIAGNOSIS — Z79899 Other long term (current) drug therapy: Secondary | ICD-10-CM

## 2019-04-01 DIAGNOSIS — Z21 Asymptomatic human immunodeficiency virus [HIV] infection status: Secondary | ICD-10-CM | POA: Diagnosis not present

## 2019-04-01 DIAGNOSIS — E785 Hyperlipidemia, unspecified: Secondary | ICD-10-CM | POA: Diagnosis not present

## 2019-04-01 LAB — COMPREHENSIVE METABOLIC PANEL
ALT: 48 U/L — ABNORMAL HIGH (ref 0–44)
AST: 41 U/L (ref 15–41)
Albumin: 4.5 g/dL (ref 3.5–5.0)
Alkaline Phosphatase: 79 U/L (ref 38–126)
Anion gap: 7 (ref 5–15)
BUN: 20 mg/dL (ref 6–20)
CO2: 27 mmol/L (ref 22–32)
Calcium: 9.5 mg/dL (ref 8.9–10.3)
Chloride: 103 mmol/L (ref 98–111)
Creatinine, Ser: 1.21 mg/dL (ref 0.61–1.24)
GFR calc Af Amer: >60 mL/min (ref >60)
GFR calc non Af Amer: >60 mL/min (ref >60)
Glucose, Bld: 83 mg/dL (ref 70–99)
Potassium: 4.4 mmol/L (ref 3.5–5.1)
Sodium: 137 mmol/L (ref 135–145)
Total Bilirubin: 0.7 mg/dL (ref 0.3–1.2)
Total Protein: 7.2 g/dL (ref 6.5–8.1)

## 2019-04-01 NOTE — Progress Notes (Signed)
NAME: Keith Steele  DOB: Aug 31, 1965  MRN: [id number]  Date/Time: 04/01/2019 9:25 AM   Subjective:   ?Follow-up visit for HIV. Keith Steele is a 54 y.o. male with a history of HIV.   He is currently on Genvoya and is 100% adherent to the medications. Does not have any side effects. No recent labs. Last labs were from June 2020 VL 28 and Cd4  Is 13  HIV diagnosed in [place] >200 OI none other than shingles HAARt history Azt, D4T, sustiva, crixivan complera Now genvoya Acquired thru sex with men Genotype-unknown Was in [place], then [place], now in [place].  Was followed by Dr. [facility] until he moved.  Patient transferred his care to me in October 2019.  ? Genotype: Unknown  Past Medical History:  Diagnosis Date  . HIV (human immunodeficiency virus infection) ([place])   . Hyperlipidemia   . Vertigo    2 episodes. last in 2017    Past Surgical History:  Procedure Laterality Date  . CHEILECTOMY Left 02/20/2018   Procedure: CHEILECTOMY;  Surgeon: Samara Deist, DPM;  Location: [facility];  Service: Podiatry;  Laterality: Left;  GENERAL WITH LOCAL  . HYDROCELE EXCISION / REPAIR    . MASS EXCISION Left 02/20/2018   Procedure: EXCISION tumor foot left;  Surgeon: Samara Deist, DPM;  Location: [facility];  Service: Podiatry;  Laterality: Left;    Social History   Socioeconomic History  . Marital status: Married    Spouse name: Not on file  . Number of children: Not on file  . Years of education: Not on file  . Highest education level: Not on file  Occupational History  . Not on file  Tobacco Use  . Smoking status: Never Smoker  . Smokeless tobacco: Never Used  Substance and Sexual Activity  . Alcohol use: Yes    Alcohol/week: 1.0 standard drinks    Types: 1 Glasses of wine per week  . Drug use: Not on file  . Sexual activity: Not on file  Other Topics Concern  . Not on file  Social History Narrative  . Not on  file   Social Determinants of Health   Financial Resource Strain:   . Difficulty of Paying Living Expenses: Not on file  Food Insecurity:   . Worried About Charity fundraiser in the Last Year: Not on file  . Ran Out of Food in the Last Year: Not on file  Transportation Needs:   . Lack of Transportation (Medical): Not on file  . Lack of Transportation (Non-Medical): Not on file  Physical Activity:   . Days of Exercise per Week: Not on file  . Minutes of Exercise per Session: Not on file  Stress:   . Feeling of Stress : Not on file  Social Connections:   . Frequency of Communication with Friends and Family: Not on file  . Frequency of Social Gatherings with Friends and Family: Not on file  . Attends Religious Services: Not on file  . Active Member of Clubs or Organizations: Not on file  . Attends Archivist Meetings: Not on file  . Marital Status: Not on file  Intimate Partner Violence:   . Fear of Current or Ex-Partner: Not on file  . Emotionally Abused: Not on file  . Physically Abused: Not on file  . Sexually Abused: Not on file    FH  Dad-alzheimers GM alzheimers Uncle-DM   No Known Allergies ? Current Outpatient  Medications  Medication Sig Dispense Refill  . elvitegravir-cobicistat-emtricitabine-tenofovir (GENVOYA) 150-150-200-10 MG TABS tablet Take 1 tablet by mouth daily. 30 tablet 5  . Multiple Vitamin (MULTIVITAMIN) tablet Take 1 tablet by mouth daily.    . pravastatin (PRAVACHOL) 20 MG tablet Take 20 mg by mouth daily.     No current facility-administered medications for this visit.    REVIEW OF SYSTEMS:  Const: negative fever, negative chills, negative weight loss Eyes: eyes watering ENT: negative coryza, negative sore throat Resp: negative cough, hemoptysis, dyspnea Cards: negative for chest pain, palpitations, lower extremity edema GU: negative for frequency, dysuria and hematuria Skin: negative for rash and pruritus Heme: negative for easy  bruising and gum/nose bleeding MS: Some pain at the site of the left great toe when he is running or doing push-ups. Neurolo:negative for headaches, dizziness, vertigo, memory problems  Psych: negative for feelings of anxiety, depression   Objective:  VITALS:  BP 115/77   Pulse 86   Temp 97.9 F (36.6 C)   Resp 16   Ht 5\' 9"  (1.753 m)   Wt 184 lb (83.5 kg)   SpO2 98%   BMI 27.17 kg/m    PHYSICAL EXAM:  General: Alert, cooperative, no distress, appears stated age.  Head: Normocephalic, without obvious abnormality, atraumatic. Eyes: Conjunctivae clear, anicteric sclerae. Pupils are equal Nose: Nares normal. No drainage or sinus tenderness. Throat: Lips, mucosa, and tongue normal. No Thrush Neck: Supple, symmetrical, no adenopathy, thyroid: non tender no carotid bruit and no JVD. Back: No CVA tenderness. Lungs: Clear to auscultation bilaterally. No Wheezing or Rhonchi. No rales. Heart: Regular rate and rhythm, no murmur, rub or gallop. Abdomen: Soft, non-tender,not distended. Bowel sounds normal. No masses Extremities: Surgical scar dorsal aspect of the right great toe.  Extremities normal, atraumatic, no cyanosis. No edema. No clubbing Skin: No rashes or lesions. Not Jaundiced Lymph: Cervical, supraclavicular normal. Neurologic: Grossly non-focal Pertinent Labs Health maintenance Vaccination  Vaccine Date last given comment  Influenza 2019   Hepatitis B    Hepatitis A    Prevnar-PCV-13    Pneumovac-PPSV-23    TdaP    HPV    Shingrix ( zoster vaccine) Oct 2020 Completed 2 doses of shingrix   ______________________  Labs Lab Result  Date comment  HIV VL     CD4     Genotype     [id number]     HIV antibody     RPR NR 09/24/18   Quantiferon Gold NR 09/24/18   Hep C ab <0.1    Hepatitis B-ab,ag,c     Hepatitis A-IgM, IgG /T     Lipid 192/48/118/132    GC/CHL     PAP     HB,PLT,Cr, LFT 14.6/242/1.19      Preventive  Procedure Result  Date comment  colonoscopy           Dental exam     Opthal       Impression/Recommendation HIV- well controlled on HAART- Genvoya ( elvitegravir+cobi+FTC+TAF) Last Vl 30  and cd4 is 670 . Discussed other HAART options including a non cobicistat regimen and possible non tenofovir regimen- will get genosure archive today and then decide  Hyperlipidemia on pravastatin  ?Partner notification- pt's partner is aware of his status and he is negative- in a monogamous relationship- U=U  ( undetectable + untransmittable)   C/o eyes watering- rest from computer screen frequently, omega 3, warm compress to the eyes, cleaning lids with baby soap  Will get the medical  records especially vaccination  from his previous provider -consent obtained Follow up 4 months? ? ? ___________________________________________________ Discussed with patient

## 2019-04-01 NOTE — Patient Instructions (Addendum)
You are here for follow up  Continue Genvoya Today we will do labs Will get your vaccination record from [place] We discussed issues with your eyes , watering, also talked about HIV meds and possible change. Follow up 4 months

## 2019-04-02 LAB — HIV-1 RNA QUANT-NO REFLEX-BLD
HIV 1 RNA Quant: 50 copies/mL
LOG10 HIV-1 RNA: 1.699 log10copy/mL

## 2019-04-02 LAB — T-HELPER CELLS CD4/CD8 %
% CD 4 Pos. Lymph.: 43.4 % (ref 30.8–58.5)
Absolute CD 4 Helper: 781 /uL (ref 359–1519)
Basophils Absolute: 0 10*3/uL (ref 0.0–0.2)
Basos: 0 %
CD3+CD4+ Cells/CD3+CD8+ Cells Bld: 1.33 (ref 0.92–3.72)
CD3+CD8+ Cells # Bld: 587 /uL (ref 109–897)
CD3+CD8+ Cells NFr Bld: 32.6 % (ref 12.0–35.5)
EOS (ABSOLUTE): 0.1 10*3/uL (ref 0.0–0.4)
Eos: 1 %
Hematocrit: 42.2 % (ref 37.5–51.0)
Hemoglobin: 14.8 g/dL (ref 13.0–17.7)
Immature Grans (Abs): 0 10*3/uL (ref 0.0–0.1)
Immature Granulocytes: 1 %
Lymphocytes Absolute: 1.8 10*3/uL (ref 0.7–3.1)
Lymphs: 29 %
MCH: 31.6 pg (ref 26.6–33.0)
MCHC: 35.1 g/dL (ref 31.5–35.7)
MCV: 90 fL (ref 79–97)
Monocytes Absolute: 0.7 10*3/uL (ref 0.1–0.9)
Monocytes: 11 %
Neutrophils Absolute: 3.7 10*3/uL (ref 1.4–7.0)
Neutrophils: 58 %
Platelets: 272 10*3/uL (ref 150–450)
RBC: 4.69 x10E6/uL (ref 4.14–5.80)
RDW: 12.7 % (ref 11.6–15.4)
WBC: 6.3 10*3/uL (ref 3.4–10.8)

## 2019-04-15 LAB — MISC LABCORP TEST (SEND OUT): Labcorp test code: 551776

## 2019-06-12 ENCOUNTER — Ambulatory Visit: Payer: BC Managed Care – PPO | Attending: Internal Medicine

## 2019-06-12 DIAGNOSIS — Z23 Encounter for immunization: Secondary | ICD-10-CM

## 2019-06-12 NOTE — Progress Notes (Signed)
   Covid-19 Vaccination Clinic  Name:  Keith Steele    MRN: [id number] DOB: July 13, 1965  06/12/2019  Keith Steele was observed post Covid-19 immunization for 15 minutes without incident. He was provided with Vaccine Information Sheet and instruction to access the V-Safe system.   Keith Steele was instructed to call 911 with any severe reactions post vaccine: [name] Difficulty breathing  . Swelling of face and throat  . A fast heartbeat  . A bad rash all over body  . Dizziness and weakness   Immunizations Administered    Name Date Dose VIS Date Route   Pfizer COVID-19 Vaccine 06/12/2019 10:41 AM 0.3 mL 03/07/2019 Intramuscular   Manufacturer: [place]   Lot: [id number]   [place]: [id number]

## 2019-07-08 ENCOUNTER — Ambulatory Visit: Payer: BC Managed Care – PPO | Attending: Internal Medicine

## 2019-07-08 DIAGNOSIS — Z23 Encounter for immunization: Secondary | ICD-10-CM

## 2019-07-08 NOTE — Progress Notes (Signed)
   Covid-19 Vaccination Clinic  Name:  Keith Steele    MRN: [id number] DOB: March 10, 1966  07/08/2019  [name] was observed post Covid-19 immunization for 15 minutes without incident. He was provided with Vaccine Information Sheet and instruction to access the V-Safe system.   [name] was instructed to call 911 with any severe reactions post vaccine: [name] Difficulty breathing  . Swelling of face and throat  . A fast heartbeat  . A bad rash all over body  . Dizziness and weakness   Immunizations Administered    Name Date Dose VIS Date Route   Pfizer COVID-19 Vaccine 07/08/2019 10:16 AM 0.3 mL 03/07/2019 Intramuscular   Manufacturer: [place]   Lot: [id number]   [place]: [id number]

## 2019-07-31 ENCOUNTER — Ambulatory Visit: Payer: BC Managed Care – PPO | Attending: Infectious Diseases | Admitting: Infectious Diseases

## 2019-07-31 ENCOUNTER — Other Ambulatory Visit: Payer: Self-pay

## 2019-07-31 ENCOUNTER — Encounter: Payer: Self-pay | Admitting: Infectious Diseases

## 2019-07-31 ENCOUNTER — Other Ambulatory Visit
Admission: RE | Admit: 2019-07-31 | Discharge: 2019-07-31 | Disposition: A | Discharge status: Home / Self Care | Payer: BC Managed Care – PPO | Attending: Infectious Diseases | Admitting: Infectious Diseases

## 2019-07-31 VITALS — BP 123/86 | HR 79 | Temp 98.1°F | Resp 16 | Ht 69.0 in | Wt 185.0 lb

## 2019-07-31 DIAGNOSIS — B20 Human immunodeficiency virus [HIV] disease: Secondary | ICD-10-CM | POA: Insufficient documentation

## 2019-07-31 LAB — COMPREHENSIVE METABOLIC PANEL
ALT: 40 U/L (ref 0–44)
AST: 31 U/L (ref 15–41)
Albumin: 4.6 g/dL (ref 3.5–5.0)
Alkaline Phosphatase: 95 U/L (ref 38–126)
Anion gap: 6 (ref 5–15)
BUN: 22 mg/dL — ABNORMAL HIGH (ref 6–20)
CO2: 28 mmol/L (ref 22–32)
Calcium: 9.2 mg/dL (ref 8.9–10.3)
Chloride: 104 mmol/L (ref 98–111)
Creatinine, Ser: 1.15 mg/dL (ref 0.61–1.24)
GFR calc Af Amer: >60 mL/min (ref >60)
GFR calc non Af Amer: >60 mL/min (ref >60)
Glucose, Bld: 99 mg/dL (ref 70–99)
Potassium: 4.4 mmol/L (ref 3.5–5.1)
Sodium: 138 mmol/L (ref 135–145)
Total Bilirubin: 0.5 mg/dL (ref 0.3–1.2)
Total Protein: 7.2 g/dL (ref 6.5–8.1)

## 2019-07-31 LAB — HEPATITIS C ANTIBODY: HCV Ab: NONREACTIVE

## 2019-07-31 NOTE — Progress Notes (Signed)
NAME: Keith Steele  DOB: 09-28-1965  MRN: [id number]  Date/Time: 07/31/2019 9:10 AM   Subjective:   ?Follow-up visit for HIV. Keith Steele is a 54 y.o. male with a history of HIV.   He is currently on Genvoya and is 100% adherent to the medications. Does not have any side effects.  Last labs were from Jan 2021 VL 51 and Cd4  Is 18   HIV diagnosed in [place] >200 OI none other than shingles HAARt history Azt, D4T, sustiva, crixivan complera Now genvoya Acquired thru sex with men Genotype-unknown Was in [place], then [place], now in [place].  Was followed by Dr. [facility] until he moved.  Patient transferred his care to me in October 2019.  ?   Past Medical History:  Diagnosis Date  . HIV (human immunodeficiency virus infection) ([place])   . Hyperlipidemia   . Vertigo    2 episodes. last in 2017    Past Surgical History:  Procedure Laterality Date  . CHEILECTOMY Left 02/20/2018   Procedure: CHEILECTOMY;  Surgeon: Samara Deist, DPM;  Location: [facility];  Service: Podiatry;  Laterality: Left;  GENERAL WITH LOCAL  . HYDROCELE EXCISION / REPAIR    . MASS EXCISION Left 02/20/2018   Procedure: EXCISION tumor foot left;  Surgeon: Samara Deist, DPM;  Location: [facility];  Service: Podiatry;  Laterality: Left;    Social History   Socioeconomic History  . Marital status: Married    Spouse name: Not on file  . Number of children: Not on file  . Years of education: Not on file  . Highest education level: Not on file  Occupational History  . Not on file  Tobacco Use  . Smoking status: Never Smoker  . Smokeless tobacco: Never Used  Substance and Sexual Activity  . Alcohol use: Yes    Alcohol/week: 1.0 standard drinks    Types: 1 Glasses of wine per week  . Drug use: Not on file  . Sexual activity: Not on file  Other Topics Concern  . Not on file  Social History Narrative  . Not on file   Social Determinants of  Health   Financial Resource Strain:   . Difficulty of Paying Living Expenses:   Food Insecurity:   . Worried About Charity fundraiser in the Last Year:   . Arboriculturist in the Last Year:   Transportation Needs:   . Film/video editor (Medical):   [name] Lack of Transportation (Non-Medical):   Physical Activity:   . Days of Exercise per Week:   . Minutes of Exercise per Session:   Stress:   . Feeling of Stress :   Social Connections:   . Frequency of Communication with Friends and Family:   . Frequency of Social Gatherings with Friends and Family:   . Attends Religious Services:   . Active Member of Clubs or Organizations:   . Attends Archivist Meetings:   [name] Marital Status:   Intimate Partner Violence:   . Fear of Current or Ex-Partner:   . Emotionally Abused:   [name] Physically Abused:   . Sexually Abused:     FH  Dad-alzheimers GM alzheimers Uncle-DM   No Known Allergies ? Current Outpatient Medications  Medication Sig Dispense Refill  . elvitegravir-cobicistat-emtricitabine-tenofovir (GENVOYA) 150-150-200-10 MG TABS tablet Take 1 tablet by mouth daily. 30 tablet 5  . Multiple Vitamin (MULTIVITAMIN) tablet Take 1 tablet by mouth daily.    [name]  pravastatin (PRAVACHOL) 20 MG tablet Take 20 mg by mouth daily.     No current facility-administered medications for this visit.    REVIEW OF SYSTEMS:  Const: negative fever, negative chills, negative weight loss Eyes: eyes watering ENT: negative coryza, negative sore throat Resp: negative cough, hemoptysis, dyspnea Cards: negative for chest pain, palpitations, lower extremity edema GU: negative for frequency, dysuria and hematuria Skin: negative for rash and pruritus Heme: negative for easy bruising and gum/nose bleeding MS: negative for any pain Neurolo:negative for headaches, dizziness, vertigo, memory problems  Psych: says he used to take wellbutrin before but stopped- wants to know whether it would help with  motivation- he will ask his PCP  Objective:  VITALS:  BP 123/86   Pulse 79   Temp 98.1 F (36.7 C) (Temporal)   Resp 16   Ht 5\' 9"  (1.753 m)   Wt 185 lb (83.9 kg)   SpO2 96%   BMI 27.32 kg/m    PHYSICAL EXAM:  General: Alert, cooperative, no distress, appears stated age.  Lungs: Clear to auscultation bilaterally. No Wheezing or Rhonchi. No rales. Heart: Regular rate and rhythm, no murmur, rub or gallop. Abdomen:did not examine' Neurologic: Grossly non-focal Pertinent Labs Health maintenance Vaccination  Vaccine Date last given comment  Influenza 2019   Hepatitis B    Hepatitis A    Prevnar-PCV-13    Pneumovac-PPSV-23    TdaP    HPV    Shingrix ( zoster vaccine) Oct 2020 Completed 2 doses of shingrix   ______________________  Labs Lab Result  Date comment  HIV VL     CD4     Genotype  mutations to nucleoside 04/01/19 K70K/R, L74L/V, M184M/V , I 178M  [id number]     HIV antibody     RPR NR 09/24/18   Quantiferon Gold NR 09/24/18   Hep C ab <0.1    Hepatitis B-ab,ag,c     Hepatitis A-IgM, IgG /T     Lipid 192/48/118/132    GC/CHL     PAP     HB,PLT,Cr, LFT 14.6/242/1.19      Preventive  Procedure Result  Date comment  colonoscopy          Dental exam     Opthal       Impression/Recommendation HIV- well controlled on HAART- Genvoya ( elvitegravir+cobi+FTC+TAF) Last Vl 50  and cd4 is 781. 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 ?

## 2019-07-31 NOTE — Patient Instructions (Signed)
You are here for follow up- we discussed 2 options-Juluca or Biktarvy - you may want to read more about it and then decide Today will do labs

## 2019-08-01 LAB — T-HELPER CELLS CD4/CD8 %
% CD 4 Pos. Lymph.: 38.2 % (ref 30.8–58.5)
Absolute CD 4 Helper: 802 /uL (ref 359–1519)
Basophils Absolute: 0 10*3/uL (ref 0.0–0.2)
Basos: 0 %
CD3+CD4+ Cells/CD3+CD8+ Cells Bld: 1.09 (ref 0.92–3.72)
CD3+CD8+ Cells # Bld: 735 /uL (ref 109–897)
CD3+CD8+ Cells NFr Bld: 35 % (ref 12.0–35.5)
EOS (ABSOLUTE): 0.1 10*3/uL (ref 0.0–0.4)
Eos: 2 %
Hematocrit: 43.3 % (ref 37.5–51.0)
Hemoglobin: 15 g/dL (ref 13.0–17.7)
Immature Grans (Abs): 0 10*3/uL (ref 0.0–0.1)
Immature Granulocytes: 0 %
Lymphocytes Absolute: 2.1 10*3/uL (ref 0.7–3.1)
Lymphs: 36 %
MCH: 31.3 pg (ref 26.6–33.0)
MCHC: 34.6 g/dL (ref 31.5–35.7)
MCV: 90 fL (ref 79–97)
Monocytes Absolute: 0.7 10*3/uL (ref 0.1–0.9)
Monocytes: 11 %
Neutrophils Absolute: 2.9 10*3/uL (ref 1.4–7.0)
Neutrophils: 51 %
Platelets: 261 10*3/uL (ref 150–450)
RBC: 4.79 x10E6/uL (ref 4.14–5.80)
RDW: 13 % (ref 11.6–15.4)
WBC: 5.7 10*3/uL (ref 3.4–10.8)

## 2019-08-01 LAB — RPR: RPR Ser Ql: NONREACTIVE

## 2019-08-01 LAB — HIV-1 RNA QUANT-NO REFLEX-BLD
HIV 1 RNA Quant: <20 copies/mL
LOG10 HIV-1 RNA: UNDETERMINED log10copy/mL

## 2019-08-02 LAB — QUANTIFERON-TB GOLD PLUS (RQFGPL)
QuantiFERON Mitogen Value: >10 IU/mL
QuantiFERON Nil Value: 0 IU/mL
QuantiFERON TB1 Ag Value: 0.02 IU/mL
QuantiFERON TB2 Ag Value: 0.03 IU/mL

## 2019-08-02 LAB — QUANTIFERON-TB GOLD PLUS: QuantiFERON-TB Gold Plus: NEGATIVE

## 2019-10-21 ENCOUNTER — Other Ambulatory Visit: Payer: Self-pay | Admitting: Infectious Diseases

## 2019-10-21 DIAGNOSIS — B20 Human immunodeficiency virus [HIV] disease: Secondary | ICD-10-CM

## 2019-10-21 NOTE — Progress Notes (Signed)
Pt currently on Genvoya and would like to do a nucleoside sparing regimen with dolutegravir and rilpavarine and to avoid cobicistat combination. 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 Before changing to Juluca will get hepatitis B labs

## 2019-10-22 ENCOUNTER — Other Ambulatory Visit
Admission: RE | Admit: 2019-10-22 | Discharge: 2019-10-22 | Disposition: A | Discharge status: Home / Self Care | Payer: BC Managed Care – PPO | Attending: Infectious Diseases | Admitting: Infectious Diseases

## 2019-10-22 DIAGNOSIS — Z5181 Encounter for therapeutic drug level monitoring: Secondary | ICD-10-CM | POA: Insufficient documentation

## 2019-10-22 DIAGNOSIS — Z79899 Other long term (current) drug therapy: Secondary | ICD-10-CM | POA: Insufficient documentation

## 2019-10-22 DIAGNOSIS — B20 Human immunodeficiency virus [HIV] disease: Secondary | ICD-10-CM | POA: Insufficient documentation

## 2019-10-22 LAB — HEPATITIS B SURFACE ANTIGEN: Hepatitis B Surface Ag: NONREACTIVE

## 2019-10-22 LAB — HEPATITIS B CORE ANTIBODY, TOTAL: Hep B Core Total Ab: NONREACTIVE

## 2019-10-22 LAB — HEPATITIS A ANTIBODY, TOTAL: hep A Total Ab: REACTIVE — AB

## 2019-10-23 LAB — HEPATITIS B SURFACE ANTIBODY, QUANTITATIVE: Hep B S AB Quant (Post): <3.1 m[IU]/mL — ABNORMAL LOW (ref >9.9)

## 2019-10-27 ENCOUNTER — Other Ambulatory Visit: Payer: Self-pay | Admitting: Infectious Diseases

## 2019-10-27 MED ORDER — JULUCA 50-25 MG PO TABS: 1.0000 | ORAL_TABLET | ORAL | 1 refills | Status: DC

## 2019-10-27 NOTE — Progress Notes (Signed)
genvoya changed to Juluca ( rilpavirine/dolutegravir) to avoid cobicistat and have a NRTI free regimen Hep B sag neg

## 2019-12-04 ENCOUNTER — Telehealth: Payer: Self-pay | Admitting: Infectious Diseases

## 2019-12-04 NOTE — Telephone Encounter (Signed)
Pt called and stated his dentist prescribed abx Amoxicillin 500mg   and Motrin 800mg  and wanted to know if it was ok if he took these medications.

## 2019-12-04 NOTE — Telephone Encounter (Signed)
Have him call the pharmacy and ask they would know all the meds and side effects

## 2019-12-16 ENCOUNTER — Other Ambulatory Visit: Payer: Self-pay | Admitting: Infectious Diseases

## 2019-12-16 ENCOUNTER — Telehealth: Payer: Self-pay | Admitting: Infectious Diseases

## 2019-12-16 DIAGNOSIS — B20 Human immunodeficiency virus [HIV] disease: Secondary | ICD-10-CM

## 2019-12-16 NOTE — Telephone Encounter (Signed)
Spoke to patient- he will get labs first and then refills- HE has 2 more weeks of meds in hand

## 2019-12-16 NOTE — Telephone Encounter (Signed)
Pt called and stated the pharmacy rejected his refill, he has questions regarding that. He also had questions regarding if you needed him to have any blood work since taking [place].

## 2019-12-16 NOTE — Progress Notes (Signed)
Spoke to patient- labs ordered - refill for Juluca to be given after lab results

## 2019-12-17 ENCOUNTER — Other Ambulatory Visit
Admission: RE | Admit: 2019-12-17 | Discharge: 2019-12-17 | Disposition: A | Discharge status: Home / Self Care | Payer: BC Managed Care – PPO | Attending: Infectious Diseases | Admitting: Infectious Diseases

## 2019-12-17 ENCOUNTER — Other Ambulatory Visit: Payer: Self-pay

## 2019-12-17 DIAGNOSIS — B20 Human immunodeficiency virus [HIV] disease: Secondary | ICD-10-CM | POA: Insufficient documentation

## 2019-12-17 LAB — COMPREHENSIVE METABOLIC PANEL WITH GFR
ALT: 37 U/L (ref 0–44)
AST: 37 U/L (ref 15–41)
Albumin: 4.7 g/dL (ref 3.5–5.0)
Alkaline Phosphatase: 77 U/L (ref 38–126)
Anion gap: 9 (ref 5–15)
BUN: 20 mg/dL (ref 6–20)
CO2: 26 mmol/L (ref 22–32)
Calcium: 9.2 mg/dL (ref 8.9–10.3)
Chloride: 101 mmol/L (ref 98–111)
Creatinine, Ser: 1.34 mg/dL — ABNORMAL HIGH (ref 0.61–1.24)
GFR calc Af Amer: >60 mL/min
GFR calc non Af Amer: 59 mL/min — ABNORMAL LOW
Glucose, Bld: 104 mg/dL — ABNORMAL HIGH (ref 70–99)
Potassium: 4.2 mmol/L (ref 3.5–5.1)
Sodium: 136 mmol/L (ref 135–145)
Total Bilirubin: 0.9 mg/dL (ref 0.3–1.2)
Total Protein: 7.5 g/dL (ref 6.5–8.1)

## 2019-12-18 ENCOUNTER — Other Ambulatory Visit: Payer: Self-pay | Admitting: Infectious Diseases

## 2019-12-18 LAB — T-HELPER CELLS CD4/CD8 %
% CD 4 Pos. Lymph.: 41.1 % (ref 30.8–58.5)
Absolute CD 4 Helper: 740 /uL (ref 359–1519)
Basophils Absolute: 0 10*3/uL (ref 0.0–0.2)
Basos: 0 %
CD3+CD4+ Cells/CD3+CD8+ Cells Bld: 1.24 (ref 0.92–3.72)
CD3+CD8+ Cells # Bld: 596 /uL (ref 109–897)
CD3+CD8+ Cells NFr Bld: 33.1 % (ref 12.0–35.5)
EOS (ABSOLUTE): 0.1 10*3/uL (ref 0.0–0.4)
Eos: 2 %
Hematocrit: 43.6 % (ref 37.5–51.0)
Hemoglobin: 15.6 g/dL (ref 13.0–17.7)
Immature Grans (Abs): 0 10*3/uL (ref 0.0–0.1)
Immature Granulocytes: 0 %
Lymphocytes Absolute: 1.8 10*3/uL (ref 0.7–3.1)
Lymphs: 34 %
MCH: 31.5 pg (ref 26.6–33.0)
MCHC: 35.8 g/dL — ABNORMAL HIGH (ref 31.5–35.7)
MCV: 88 fL (ref 79–97)
Monocytes Absolute: 0.5 10*3/uL (ref 0.1–0.9)
Monocytes: 10 %
Neutrophils Absolute: 2.9 10*3/uL (ref 1.4–7.0)
Neutrophils: 54 %
Platelets: 283 10*3/uL (ref 150–450)
RBC: 4.95 x10E6/uL (ref 4.14–5.80)
RDW: 12.2 % (ref 11.6–15.4)
WBC: 5.4 10*3/uL (ref 3.4–10.8)

## 2019-12-18 LAB — HIV-1 RNA QUANT-NO REFLEX-BLD
HIV 1 RNA Quant: 60 copies/mL
LOG10 HIV-1 RNA: 1.778 log10copy/mL

## 2019-12-18 MED ORDER — JULUCA 50-25 MG PO TABS: 1.0000 | ORAL_TABLET | ORAL | 1 refills | Status: DC

## 2019-12-23 IMAGING — MR MR FOOT*L* WO/W CM
11 series · 40 of 40 positions shown · IV contrast (gadavist)
Comparison: None.

CLINICAL DATA: Painful bone, left great toe for 10 weeks

EXAM:
MRI OF THE LEFT FOREFOOT WITHOUT AND WITH CONTRAST
TECHNIQUE: Multiplanar, multisequence MR imaging of the left forefoot was
performed both before and after administration of intravenous
contrast.
CONTRAST:  7.5 mL Gadavist

[Series 4: T1 · coronal · left · 3.0mm · 0.38mm/px · 6 of 49 slices shown (1 of 2)]
[im 1/49]
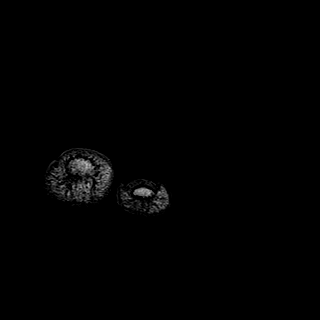
[im 10/49]
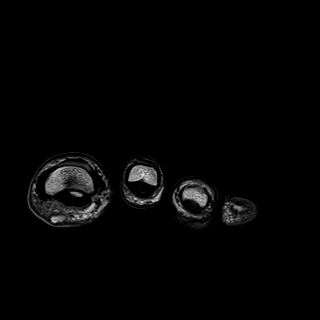
[im 20/49]
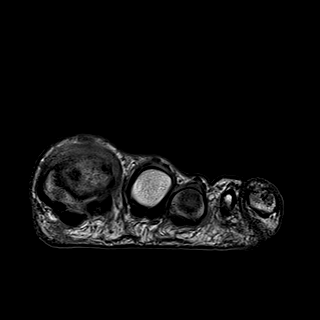
[im 29/49]
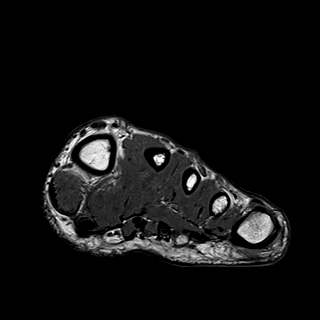
[im 39/49]
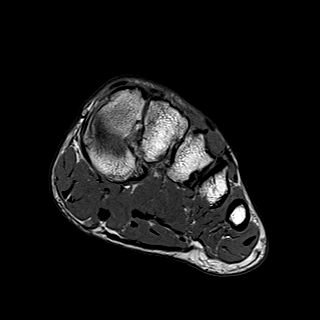
[im 49/49]
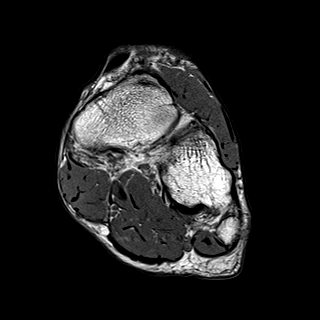

[Series 5: T2 · coronal · left · 3.0mm · 0.38mm/px · 5 of 49 slices shown (1 of 4)]
[im 1/49]
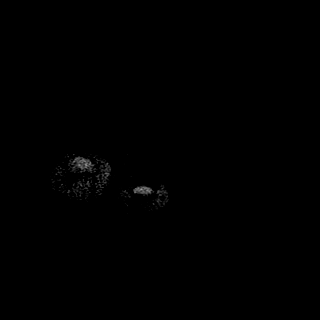
[im 13/49]
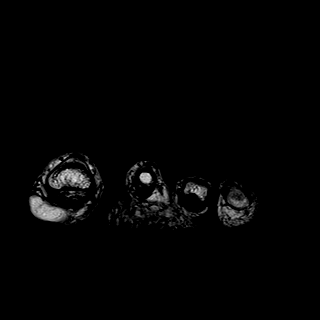
[im 25/49]
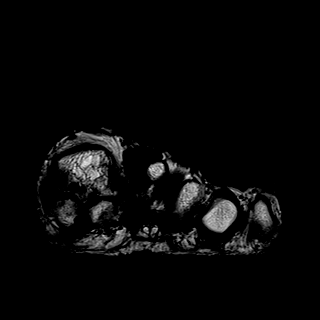
[im 37/49]
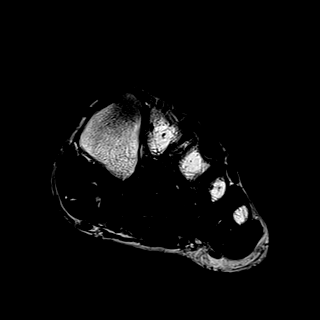
[im 49/49]
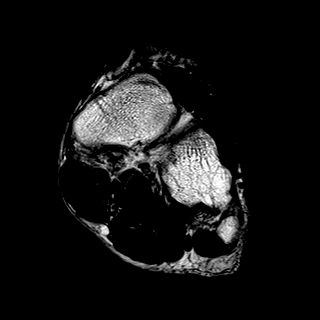

[Series 6: T2 · coronal · left · 3.0mm · 0.38mm/px · 5 of 49 slices shown (2 of 4)]
[im 1/49]
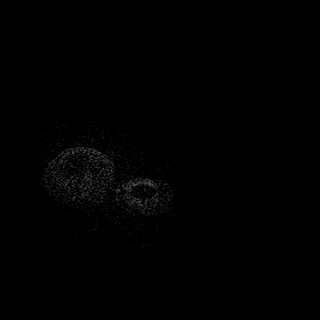
[im 13/49]
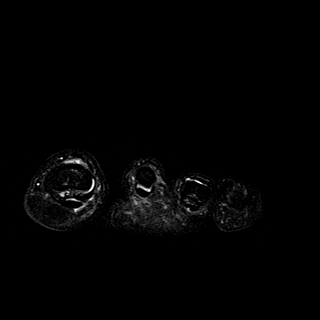
[im 25/49]
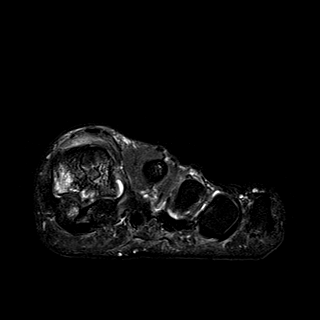
[im 37/49]
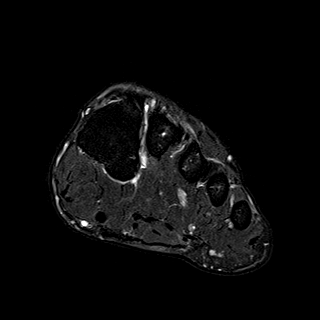
[im 49/49]
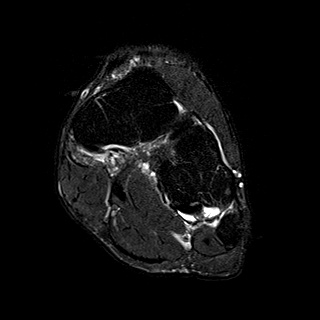

[Series 7: T1 · axial · left · 3.0mm · 0.70mm/px · z∈[-113,-36]mm · 2 of 21 slices shown (2 of 2)]
[im 1/21]
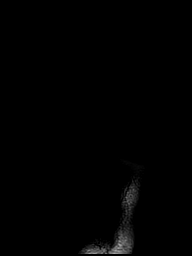
[im 21/21]
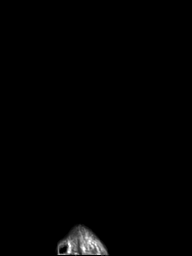

[Series 8: T2 · axial · left · 3.0mm · 0.70mm/px · z∈[-113,-36]mm · 2 of 21 slices shown (3 of 4)]
[im 1/21]
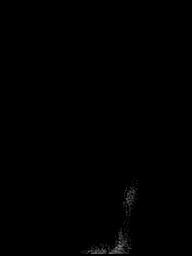
[im 21/21]
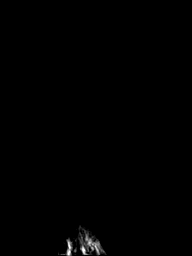

[Series 9: T2 · axial · left · 3.0mm · 0.70mm/px · z∈[-113,-36]mm · 2 of 21 slices shown (4 of 4)]
[im 1/21]
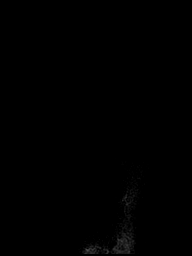
[im 21/21]
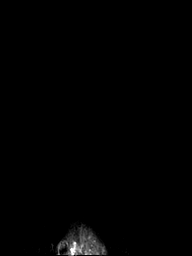

[Series 10: STIR · sagittal · left · 3.0mm · 0.70mm/px · 3 of 31 slices shown]
[im 1/31]
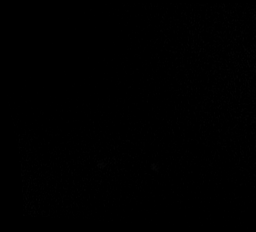
[im 16/31]
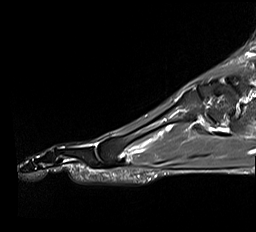
[im 31/31]
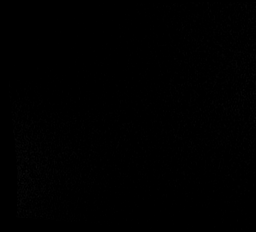

[Series 11: T1 fat-sat · coronal · non-contrast · left · 3.0mm · 0.47mm/px · 5 of 49 slices shown (1 of 3)]
[im 1/49]
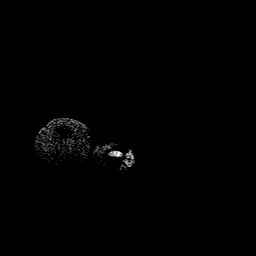
[im 13/49]
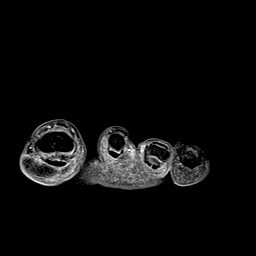
[im 25/49]
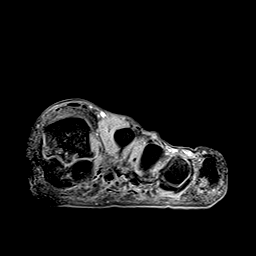
[im 37/49]
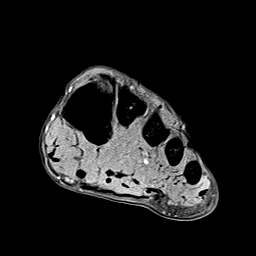
[im 49/49]
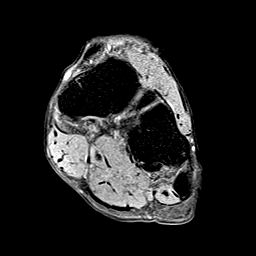

[Series 12: T1 fat-sat post-contrast · coronal · left · 3.0mm · 0.47mm/px · 5 of 49 slices shown]
[im 1/49]
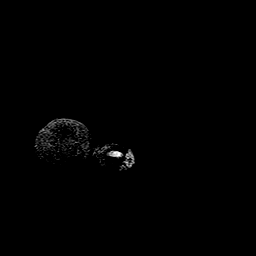
[im 13/49]
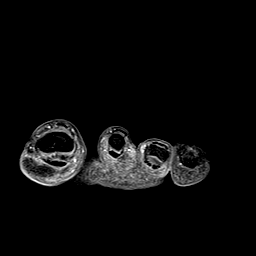
[im 25/49]
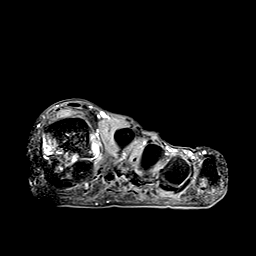
[im 37/49]
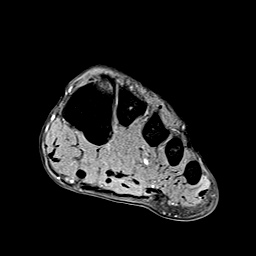
[im 49/49]
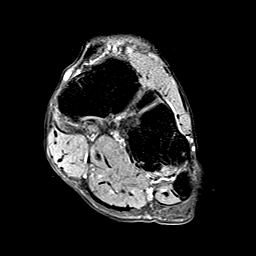

[Series 13: T1 fat-sat · sagittal · left · 3.0mm · 0.70mm/px · 3 of 31 slices shown (2 of 3)]
[im 1/31]
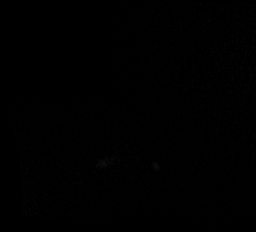
[im 16/31]
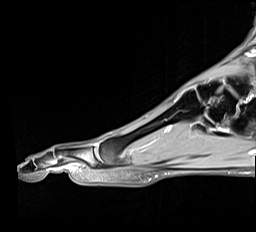
[im 31/31]
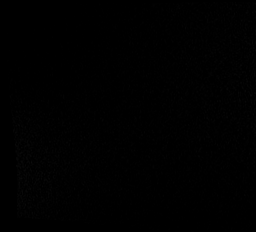

[Series 14: T1 fat-sat · axial · left · 3.0mm · 0.56mm/px · z∈[-113,-40]mm · 2 of 20 slices shown (3 of 3)]
[im 1/20]
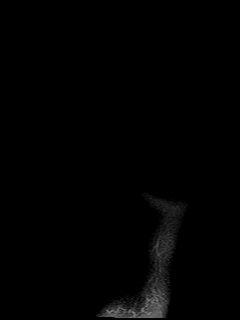
[im 20/20]
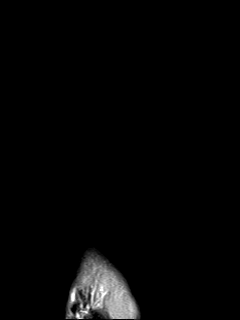

[40 of 40 positions shown; findings below may reference images not displayed]

FINDINGS: Bones/Joint/Cartilage

No fracture or dislocation. Normal alignment. Moderate
osteoarthritis crash that moderate-severe osteoarthritis of the
first MTP joint with subchondral reactive marrow edema. Moderate
first MTP joint effusion. Mild degenerative changes of the medial
hallux sesamoid.

Ligaments

Collateral ligaments are intact.  Lisfranc ligament is intact.

Muscles and Tendons
Flexor, peroneal and extensor compartment tendons are intact.
Muscles are normal.

Soft tissue
No fluid collection or hematoma. No soft tissue mass. 1.5 x 0.8 x
1.5 cm T1 hyperintense mass with signal dropout on fat saturated
images most consistent with a small lipoma along the plantar aspect
of the great toe.
IMPRESSION: 1. 1.5 x 0.8 x 1.5 cm lipoma along the plantar aspect of the great
toe.
2. Moderate osteoarthritis crash that moderate-severe osteoarthritis
of the first MTP joint with subchondral reactive marrow edema.
Moderate first MTP joint effusion.
3. Mild degenerative changes of the medial hallux sesamoid.

## 2019-12-26 ENCOUNTER — Telehealth: Payer: Self-pay | Admitting: Infectious Diseases

## 2019-12-26 NOTE — Telephone Encounter (Signed)
Returned patient's call. He has been having dizziness for the past 2 weeks and he started a new HAARt Juluca 6 weeks ago. Ripavirine in [place] may be the cause- Asked him to take the pill at night time or stop it and return to the old HAART genvoya- he prefers to go back on [place] and see . Will talk on Tuesday again

## 2020-01-01 ENCOUNTER — Other Ambulatory Visit: Payer: Self-pay

## 2020-01-02 ENCOUNTER — Other Ambulatory Visit: Payer: Self-pay

## 2020-01-02 MED ORDER — GENVOYA 150-150-200-10 MG PO TABS: 1.0000 | ORAL_TABLET | Freq: Every day | ORAL | 3 refills | Status: AC

## 2020-02-03 ENCOUNTER — Ambulatory Visit: Payer: BC Managed Care – PPO | Admitting: Infectious Diseases
